# Patient Record
Sex: Male | Born: 1935 | Race: White | Hispanic: No | Marital: Married
Health system: Southern US, Community
[De-identification: ages and names within clinical notes are randomized; demographics above are authoritative.]

---

## 1998-04-06 ENCOUNTER — Ambulatory Visit (HOSPITAL_COMMUNITY): Admission: RE | Admit: 1998-04-06 | Discharge: 1998-04-06 | Payer: Self-pay | Admitting: Cardiology

## 1998-04-21 ENCOUNTER — Ambulatory Visit (HOSPITAL_COMMUNITY): Admission: RE | Admit: 1998-04-21 | Discharge: 1998-04-21 | Payer: Self-pay | Admitting: Cardiology

## 2000-05-31 ENCOUNTER — Other Ambulatory Visit: Admission: RE | Admit: 2000-05-31 | Discharge: 2000-05-31 | Payer: Self-pay | Admitting: Gastroenterology

## 2000-05-31 ENCOUNTER — Encounter (INDEPENDENT_AMBULATORY_CARE_PROVIDER_SITE_OTHER): Payer: Self-pay | Admitting: Specialist

## 2001-05-16 ENCOUNTER — Ambulatory Visit (HOSPITAL_COMMUNITY): Admission: RE | Admit: 2001-05-16 | Discharge: 2001-05-16 | Payer: Self-pay | Admitting: Cardiology

## 2001-05-16 ENCOUNTER — Encounter: Payer: Self-pay | Admitting: Cardiology

## 2001-06-11 ENCOUNTER — Encounter: Payer: Self-pay | Admitting: Vascular Surgery

## 2001-06-12 ENCOUNTER — Inpatient Hospital Stay (HOSPITAL_COMMUNITY): Admission: RE | Admit: 2001-06-12 | Discharge: 2001-06-13 | Payer: Self-pay | Admitting: Vascular Surgery

## 2001-06-12 ENCOUNTER — Encounter (INDEPENDENT_AMBULATORY_CARE_PROVIDER_SITE_OTHER): Payer: Self-pay | Admitting: *Deleted

## 2006-03-28 ENCOUNTER — Ambulatory Visit: Payer: Self-pay | Admitting: Family Medicine

## 2006-05-25 ENCOUNTER — Ambulatory Visit: Payer: Self-pay | Admitting: Rheumatology

## 2007-02-25 ENCOUNTER — Emergency Department (HOSPITAL_COMMUNITY): Admission: EM | Admit: 2007-02-25 | Discharge: 2007-02-25 | Payer: Self-pay | Admitting: Emergency Medicine

## 2007-04-25 ENCOUNTER — Ambulatory Visit: Payer: Self-pay | Admitting: Family Medicine

## 2007-06-25 ENCOUNTER — Ambulatory Visit: Payer: Self-pay | Admitting: Vascular Surgery

## 2008-07-11 ENCOUNTER — Ambulatory Visit: Payer: Self-pay | Admitting: Vascular Surgery

## 2009-06-22 ENCOUNTER — Ambulatory Visit: Payer: Self-pay | Admitting: Vascular Surgery

## 2009-07-22 ENCOUNTER — Inpatient Hospital Stay (HOSPITAL_COMMUNITY): Admission: EM | Admit: 2009-07-22 | Discharge: 2009-08-02 | Payer: Self-pay | Admitting: Emergency Medicine

## 2009-07-24 ENCOUNTER — Encounter (INDEPENDENT_AMBULATORY_CARE_PROVIDER_SITE_OTHER): Payer: Self-pay | Admitting: Internal Medicine

## 2009-07-24 ENCOUNTER — Ambulatory Visit: Payer: Self-pay | Admitting: Oncology

## 2009-07-27 ENCOUNTER — Encounter (INDEPENDENT_AMBULATORY_CARE_PROVIDER_SITE_OTHER): Payer: Self-pay | Admitting: Interventional Radiology

## 2009-07-31 ENCOUNTER — Encounter (INDEPENDENT_AMBULATORY_CARE_PROVIDER_SITE_OTHER): Payer: Self-pay | Admitting: Internal Medicine

## 2009-08-14 ENCOUNTER — Encounter: Payer: Self-pay | Admitting: Interventional Radiology

## 2010-07-29 ENCOUNTER — Ambulatory Visit: Payer: Self-pay | Admitting: Vascular Surgery

## 2010-09-30 ENCOUNTER — Inpatient Hospital Stay: Payer: Self-pay | Admitting: Internal Medicine

## 2010-12-15 LAB — BODY FLUID CULTURE: Culture: NO GROWTH

## 2010-12-15 LAB — CBC
HCT: 34.1 % — ABNORMAL LOW (ref 39.0–52.0)
HCT: 28.8 % — ABNORMAL LOW (ref 39.0–52.0)
HCT: 32 % — ABNORMAL LOW (ref 39.0–52.0)
HCT: 33.1 % — ABNORMAL LOW (ref 39.0–52.0)
Hemoglobin: 10 g/dL — ABNORMAL LOW (ref 13.0–17.0)
Hemoglobin: 10.4 g/dL — ABNORMAL LOW (ref 13.0–17.0)
Hemoglobin: 10 g/dL — ABNORMAL LOW (ref 13.0–17.0)
Hemoglobin: 10 g/dL — ABNORMAL LOW (ref 13.0–17.0)
Hemoglobin: 11.4 g/dL — ABNORMAL LOW (ref 13.0–17.0)
MCHC: 33.7 g/dL (ref 30.0–36.0)
MCHC: 33.7 g/dL (ref 30.0–36.0)
MCHC: 33.3 g/dL (ref 30.0–36.0)
MCHC: 34.1 g/dL (ref 30.0–36.0)
MCHC: 34.8 g/dL (ref 30.0–36.0)
MCV: 93.4 fL (ref 78.0–100.0)
MCV: 93.6 fL (ref 78.0–100.0)
MCV: 93.8 fL (ref 78.0–100.0)
MCV: 93.8 fL (ref 78.0–100.0)
MCV: 92.7 fL (ref 78.0–100.0)
MCV: 93.1 fL (ref 78.0–100.0)
MCV: 94.3 fL (ref 78.0–100.0)
Platelets: 289 10*3/uL (ref 150–400)
Platelets: 299 10*3/uL (ref 150–400)
Platelets: 252 10*3/uL (ref 150–400)
RBC: 3.17 MIL/uL — ABNORMAL LOW (ref 4.22–5.81)
RBC: 3.39 MIL/uL — ABNORMAL LOW (ref 4.22–5.81)
RBC: 3.62 MIL/uL — ABNORMAL LOW (ref 4.22–5.81)
RBC: 3.66 MIL/uL — ABNORMAL LOW (ref 4.22–5.81)
RBC: 3.1 MIL/uL — ABNORMAL LOW (ref 4.22–5.81)
RBC: 3.2 MIL/uL — ABNORMAL LOW (ref 4.22–5.81)
RBC: 3.44 MIL/uL — ABNORMAL LOW (ref 4.22–5.81)
RBC: 3.59 MIL/uL — ABNORMAL LOW (ref 4.22–5.81)
RDW: 15.3 % (ref 11.5–15.5)
RDW: 15.7 % — ABNORMAL HIGH (ref 11.5–15.5)
RDW: 15.6 % — ABNORMAL HIGH (ref 11.5–15.5)
RDW: 15.6 % — ABNORMAL HIGH (ref 11.5–15.5)
WBC: 8.5 10*3/uL (ref 4.0–10.5)
WBC: 8.9 10*3/uL (ref 4.0–10.5)
WBC: 8.9 10*3/uL (ref 4.0–10.5)
WBC: 4.7 10*3/uL (ref 4.0–10.5)
WBC: 7.3 10*3/uL (ref 4.0–10.5)
WBC: 7.6 10*3/uL (ref 4.0–10.5)

## 2010-12-15 LAB — URINALYSIS, ROUTINE W REFLEX MICROSCOPIC
Ketones, ur: 15 mg/dL — AB
Nitrite: NEGATIVE
Protein, ur: NEGATIVE mg/dL
pH: 5 (ref 5.0–8.0)

## 2010-12-15 LAB — BODY FLUID CELL COUNT WITH DIFFERENTIAL
Eos, Fluid: 1 %
Lymphs, Fluid: 53 %
Monocyte-Macrophage-Serous Fluid: 30 % — ABNORMAL LOW (ref 50–90)

## 2010-12-15 LAB — BASIC METABOLIC PANEL
BUN: 10 mg/dL (ref 6–23)
BUN: 16 mg/dL (ref 6–23)
BUN: 16 mg/dL (ref 6–23)
BUN: 32 mg/dL — ABNORMAL HIGH (ref 6–23)
CO2: 23 mEq/L (ref 19–32)
CO2: 24 mEq/L (ref 19–32)
CO2: 23 mEq/L (ref 19–32)
CO2: 24 mEq/L (ref 19–32)
Calcium: 8.4 mg/dL (ref 8.4–10.5)
Calcium: 8.9 mg/dL (ref 8.4–10.5)
Calcium: 8.8 mg/dL (ref 8.4–10.5)
Calcium: 9 mg/dL (ref 8.4–10.5)
Chloride: 106 mEq/L (ref 96–112)
Chloride: 112 mEq/L (ref 96–112)
Chloride: 114 mEq/L — ABNORMAL HIGH (ref 96–112)
Chloride: 108 mEq/L (ref 96–112)
Chloride: 110 mEq/L (ref 96–112)
Chloride: 112 mEq/L (ref 96–112)
Creatinine, Ser: 1.2 mg/dL (ref 0.4–1.5)
Creatinine, Ser: 1.25 mg/dL (ref 0.4–1.5)
Creatinine, Ser: 1.34 mg/dL (ref 0.4–1.5)
Creatinine, Ser: 1.36 mg/dL (ref 0.4–1.5)
Creatinine, Ser: 1.28 mg/dL (ref 0.4–1.5)
Creatinine, Ser: 1.43 mg/dL (ref 0.4–1.5)
Creatinine, Ser: 1.48 mg/dL (ref 0.4–1.5)
GFR calc Af Amer: 60 mL/min — ABNORMAL LOW (ref >60)
GFR calc Af Amer: >60 mL/min (ref >60)
GFR calc Af Amer: >60 mL/min (ref >60)
GFR calc Af Amer: >60 mL/min (ref >60)
GFR calc Af Amer: 55 mL/min — ABNORMAL LOW (ref >60)
GFR calc Af Amer: 57 mL/min — ABNORMAL LOW (ref >60)
GFR calc Af Amer: >60 mL/min (ref >60)
GFR calc non Af Amer: 50 mL/min — ABNORMAL LOW (ref >60)
GFR calc non Af Amer: 55 mL/min — ABNORMAL LOW (ref >60)
GFR calc non Af Amer: 38 mL/min — ABNORMAL LOW (ref >60)
GFR calc non Af Amer: 46 mL/min — ABNORMAL LOW (ref >60)
Glucose, Bld: 102 mg/dL — ABNORMAL HIGH (ref 70–99)
Glucose, Bld: 109 mg/dL — ABNORMAL HIGH (ref 70–99)
Glucose, Bld: 138 mg/dL — ABNORMAL HIGH (ref 70–99)
Potassium: 4 mEq/L (ref 3.5–5.1)
Potassium: 4 mEq/L (ref 3.5–5.1)
Potassium: 4 mEq/L (ref 3.5–5.1)
Potassium: 3.8 mEq/L (ref 3.5–5.1)
Potassium: 3.8 mEq/L (ref 3.5–5.1)
Potassium: 3.9 mEq/L (ref 3.5–5.1)
Potassium: 4.7 mEq/L (ref 3.5–5.1)
Sodium: 139 mEq/L (ref 135–145)
Sodium: 142 mEq/L (ref 135–145)
Sodium: 134 mEq/L — ABNORMAL LOW (ref 135–145)
Sodium: 139 mEq/L (ref 135–145)
Sodium: 142 mEq/L (ref 135–145)

## 2010-12-15 LAB — GLUCOSE, CAPILLARY
Glucose-Capillary: 105 mg/dL — ABNORMAL HIGH (ref 70–99)
Glucose-Capillary: 112 mg/dL — ABNORMAL HIGH (ref 70–99)
Glucose-Capillary: 127 mg/dL — ABNORMAL HIGH (ref 70–99)
Glucose-Capillary: 132 mg/dL — ABNORMAL HIGH (ref 70–99)
Glucose-Capillary: 163 mg/dL — ABNORMAL HIGH (ref 70–99)
Glucose-Capillary: 170 mg/dL — ABNORMAL HIGH (ref 70–99)
Glucose-Capillary: 174 mg/dL — ABNORMAL HIGH (ref 70–99)
Glucose-Capillary: 262 mg/dL — ABNORMAL HIGH (ref 70–99)
Glucose-Capillary: 104 mg/dL — ABNORMAL HIGH (ref 70–99)
Glucose-Capillary: 139 mg/dL — ABNORMAL HIGH (ref 70–99)
Glucose-Capillary: 164 mg/dL — ABNORMAL HIGH (ref 70–99)
Glucose-Capillary: 168 mg/dL — ABNORMAL HIGH (ref 70–99)
Glucose-Capillary: 214 mg/dL — ABNORMAL HIGH (ref 70–99)
Glucose-Capillary: 253 mg/dL — ABNORMAL HIGH (ref 70–99)

## 2010-12-15 LAB — CK TOTAL AND CKMB (NOT AT ARMC)
CK, MB: 1.7 ng/mL (ref 0.3–4.0)
Relative Index: 1.3 (ref 0.0–2.5)
Total CK: 128 U/L (ref 7–232)

## 2010-12-15 LAB — DIFFERENTIAL
Basophils Absolute: 0.1 10*3/uL (ref 0.0–0.1)
Basophils Relative: 1 % (ref 0–1)
Lymphocytes Relative: 3 % — ABNORMAL LOW (ref 12–46)
Monocytes Absolute: 0.1 10*3/uL (ref 0.1–1.0)
Neutro Abs: 4.4 10*3/uL (ref 1.7–7.7)
Neutrophils Relative %: 93 % — ABNORMAL HIGH (ref 43–77)

## 2010-12-15 LAB — POCT I-STAT, CHEM 8
Glucose, Bld: 235 mg/dL — ABNORMAL HIGH (ref 70–99)
HCT: 35 % — ABNORMAL LOW (ref 39.0–52.0)
Hemoglobin: 11.9 g/dL — ABNORMAL LOW (ref 13.0–17.0)
Potassium: 4.8 mEq/L (ref 3.5–5.1)

## 2010-12-15 LAB — HEPATIC FUNCTION PANEL
ALT: 24 U/L (ref 0–53)
Alkaline Phosphatase: 132 U/L — ABNORMAL HIGH (ref 39–117)
Bilirubin, Direct: <0.1 mg/dL (ref 0.0–0.3)
Total Bilirubin: 0.3 mg/dL (ref 0.3–1.2)
Total Protein: 6 g/dL (ref 6.0–8.3)

## 2010-12-15 LAB — LACTATE DEHYDROGENASE: LDH: 166 U/L (ref 94–250)

## 2010-12-15 LAB — PROTEIN ELECTROPH W RFLX QUANT IMMUNOGLOBULINS
Alpha-1-Globulin: 8.5 % — ABNORMAL HIGH (ref 2.9–4.9)
Alpha-2-Globulin: 15.8 % — ABNORMAL HIGH (ref 7.1–11.8)
Beta 2: 5.7 % (ref 3.2–6.5)
Beta Globulin: 7.4 % — ABNORMAL HIGH (ref 4.7–7.2)
Gamma Globulin: 13.7 % (ref 11.1–18.8)

## 2010-12-15 LAB — HEMOGLOBIN A1C
Hgb A1c MFr Bld: 8.4 % — ABNORMAL HIGH (ref 4.6–6.1)
Mean Plasma Glucose: 194 mg/dL

## 2010-12-15 LAB — URINE CULTURE
Colony Count: NO GROWTH
Special Requests: NEGATIVE

## 2010-12-15 LAB — CULTURE, BLOOD (ROUTINE X 2)

## 2010-12-15 LAB — PROTEIN, BODY FLUID: Total protein, fluid: 3.2 g/dL

## 2010-12-15 LAB — PROTEIN, TOTAL: Total Protein: 5.6 g/dL — ABNORMAL LOW (ref 6.0–8.3)

## 2010-12-15 LAB — LACTATE DEHYDROGENASE, PLEURAL OR PERITONEAL FLUID: LD, Fluid: 93 U/L — ABNORMAL HIGH (ref 3–23)

## 2010-12-15 LAB — POCT CARDIAC MARKERS: CKMB, poc: 1.8 ng/mL (ref 1.0–8.0)

## 2010-12-15 LAB — TROPONIN I: Troponin I: 0.02 ng/mL (ref 0.00–0.06)

## 2011-01-25 NOTE — Procedures (Signed)
CAROTID DUPLEX EXAM   INDICATION:  Follow up known carotid artery disease.   HISTORY:  Diabetes:  Yes  Cardiac:  MI in 1993  Hypertension:  No  Smoking:  No  Previous Surgery:  Left carotid endarterectomy.  CV History:  Amaurosis Fugax  No  Paresthesias No  Hemiparesis No                                       RIGHT             LEFT  Brachial systolic pressure:         150               140  Brachial Doppler waveforms:         Biphasic          Biphasic  Vertebral direction of flow:        Antegrade         Antegrade  DUPLEX VELOCITIES (cm/sec)  CCA peak systolic                   89                75  ECA peak systolic                   226               120  ICA peak systolic                   156               98  ICA end diastolic                   18                26  PLAQUE MORPHOLOGY:                  Calcified         Calcified  PLAQUE AMOUNT:                      Moderate          Mild  PLAQUE LOCATION:                    ICA and ECA       CCA   IMPRESSION:  40% to 59% stenosis noted in the right internal carotid  artery.  Normal carotid duplex noted in the left internal carotid  artery.  Antegrade bilateral vertebral arteries.   ___________________________________________  Larina Earthly, M.D.   MG/MEDQ  D:  06/25/2007  T:  06/26/2007  Job:  [id number]

## 2011-01-25 NOTE — Procedures (Signed)
CAROTID DUPLEX EXAM   INDICATION:  Follow up carotid artery disease.   HISTORY:  Diabetes:  Yes.  Cardiac:  MI.  Hypertension:  No.  Smoking:  No.  Previous Surgery:  Left carotid endarterectomy with DPA performed  06/12/2001.  CV History:  Patient is currently asymptomatic.  Amaurosis Fugax No, Paresthesias No, Hemiparesis No.                                       RIGHT             LEFT  Brachial systolic pressure:         154               156  Brachial Doppler waveforms:         WNL               WNL  Vertebral direction of flow:        Antegrade         Antegrade  DUPLEX VELOCITIES (cm/sec)  CCA peak systolic                   63                74  ECA peak systolic                   205               86  ICA peak systolic                   142               93  ICA end diastolic                   17                24  PLAQUE MORPHOLOGY:                  Heterogenous      Heterogenous  PLAQUE AMOUNT:                      Moderate          Mild  PLAQUE LOCATION:                    ICA, ECA          CCA   IMPRESSION:  1. Right internal carotid artery stenosis of 40% to 59%.  2. Left internal carotid artery is 1% to 39% stenosis.  3. Right-sided external carotid artery stenosis.    ___________________________________________  Janetta Hora Fields, MD   OD/MEDQ  D:  07/29/2010  T:  07/29/2010  Job:  [id number]

## 2011-01-25 NOTE — Procedures (Signed)
CAROTID DUPLEX EXAM   INDICATION:  Follow up carotid artery disease.   HISTORY:  Diabetes:  Yes  Cardiac:  MI  Hypertension:  No  Smoking:  No  Previous Surgery:  Left CEA with DPA on 06/12/2001.  CV History:  No  Amaurosis Fugax No, Paresthesias No, Hemiparesis No                                       RIGHT             LEFT  Brachial systolic pressure:         150               152  Brachial Doppler waveforms:         triphasic         triphasic  Vertebral direction of flow:        antegrade         antegrade  DUPLEX VELOCITIES (cm/sec)  CCA peak systolic                   100               128  ECA peak systolic                   300               76  ICA peak systolic                   193               118  ICA end diastolic                   21                22  PLAQUE MORPHOLOGY:                  calcified         calcified  PLAQUE AMOUNT:                      moderate          mild  PLAQUE LOCATION:                    ICA, ECA          CCA   IMPRESSION:  1. Right internal carotid artery shows evidence of 40% to 59%      stenosis.  2. Left internal carotid artery shows evidence of 1% to 39% stenosis.  3. Right external carotid artery shows evidence of stenosis.  4. Antegrade flow in bilateral vertebrals.   ___________________________________________  Larina Earthly, M.D.   CB/MEDQ  D:  06/22/2009  T:  06/23/2009  Job:  [id number]

## 2011-01-25 NOTE — Assessment & Plan Note (Signed)
OFFICE VISIT   Dennis Duffy, DEMARAIS  DOB:  September 08, 1926                                       07/29/2010  UEAVW#:[id number]   CHIEF COMPLAINT:  Follow-up status post left carotid endarterectomy in  2002.   HISTORY OF PRESENT ILLNESS:  Patient is an 77 year old gentleman who had  a left carotid endarterectomy by Dr. Arbie Cookey in 2002.  He is here today  with his daughter.  He denies any issues of TIA or stroke signs or  symptoms.  He states he occasionally has difficulty speaking and getting  his words out, but this is secondary to age and not a new problem.  Otherwise he denies any weakness on one side or the other or any  symptoms of amaurosis fugax.   Vascular labs are stable.  Shows a right internal carotid artery  stenosis of 40% to 59% and a left of 1% to 39%.  He has no carotid  bruits bilaterally.  His peak systolic velocity on the right was 142 and  on the left was 93.  The end-diastolic velocity was 17 on the right and  24 on the left.   PHYSICAL EXAMINATION:  This is a very frail 77 year old gentleman in no  acute distress.  He is alert and oriented and speaking appropriately and  answering questions without difficulty.  His heart rate is 77.  His  blood pressure is 106/64.  His sats are 95%.  He has no facial droop.  He has good and equal strength in bilateral upper and lower extremities.  He has a well-healed left neck scar.  Heart rate and rhythm is regular.  He has a 2/6 systolic ejection murmur.   ASSESSMENT:  Patient who is status post left carotid endarterectomy in  2002 with stable vascular laboratories.  No bruit and no symptoms.   PLAN:  He will follow up in 1 year with further vascular labs.   Della Goo, PA-C   Charles E. Fields, MD  Electronically Signed   RR/MEDQ  D:  07/29/2010  T:  07/29/2010  Job:  [id number]

## 2011-01-25 NOTE — Procedures (Signed)
CAROTID DUPLEX EXAM   INDICATION:  Follow up carotid artery disease.   HISTORY:  Diabetes:  Yes.  Cardiac:  MI.  Hypertension:  No.  Smoking:  No.  Previous Surgery:  Left CEA with DPA on 06/12/2001.  CV History:  No.  Amaurosis Fugax No, Paresthesias No, Hemiparesis No.                                       RIGHT             LEFT  Brachial systolic pressure:         158               160  Brachial Doppler waveforms:         Triphasic         Triphasic  Vertebral direction of flow:        Antegrade         Antegrade  DUPLEX VELOCITIES (cm/sec)  CCA peak systolic                   62                72  ECA peak systolic                   205               85  ICA peak systolic                   133               101  ICA end diastolic                   29                32  PLAQUE MORPHOLOGY:                  Calcified         Calcified  PLAQUE AMOUNT:                      Moderate          Mild  PLAQUE LOCATION:                    ICA/ECA           CCA   IMPRESSION:  1. Right internal carotid artery shows evidence of 40-59% stenosis.  2. Left internal carotid artery shows evidence of 1-39% stenosis.  3. Right external carotid artery stenosis.  4. No significant changes from previous study.   ___________________________________________  Larina Earthly, M.D.   AS/MEDQ  D:  07/11/2008  T:  07/11/2008  Job:  [id number]

## 2011-01-28 NOTE — Discharge Summary (Signed)
[place]  Patient:    Dennis Duffy, Dennis Duffy Visit Number: [id number] MRN: [id number]          Service Type: SUR Location: [id number] Attending Physician:  Alyson Locket Dictated by:   Lissa Merlin, P.A.C. Admit Date:  06/12/2001 Discharge Date: 06/13/2001   CC:         CVTS office  Dr. Sullivan Lone in [place], [place].   Discharge Summary  DATE OF BIRTH:  [birth year].  PHYSICIANS: 1. Larina Earthly, M.D., surgeon. 2. Dr. Sullivan Lone, primary care, [place], [place].  ADMITTING DIAGNOSIS:  Severe asymptomatic left internal carotid artery stenosis greater than 80%.  DISCHARGE DIAGNOSIS:  Severe asymptomatic left internal carotid artery stenosis greater than 80%.  PAST MEDICAL HISTORY:  Known carotid artery disease followed by doppler. Coronary artery disease with previous CABG.  Hypertension. Noninsulin-dependent diabetes mellitus.  Hypothyroidism.  Benign prostatic hypertrophy.  GERD. Glaucoma.  PROCEDURES:  Left carotid endarterectomy with Dacron patch angioplasty on June 12, 2001 by Dr. Kristen Loader. Early.  BRIEF HISTORY:  Mr. Weatherall is a 77 year old male with known carotid artery disease followed by doppler since March of 2002.  His last doppler showed severe left distal common carotid artery flow reduction bilateral, external cranial artery flow reduction greater than 80%.  Dr. Kristen Loader. Early recommended proceeding with left carotid endarterectomy in order to reduce the risk of stroke.  The patient has been completely asymptomatic prior to admission.  Risks, benefits, details, and alternatives of surgery were discussed and it was agreed to proceed.  HOSPITAL COURSE:  Mr. Buenger comes to the hospital on June 12, 2001 for the elected procedure.  There were no complications.  He was taken to PACU in stable condition.  Postoperatively, Mr. Chaput remained stable with no complications.  He was taken to unit 3300 where he did  well overnight.  The following day, he was afebrile, vital signs were stable, in sinus rhythm, and he was neurologically intact.  His neck wound was well with no edema or bleeding.  He was ambulating without difficulty and taking p.o. as well.  Laboratory work was satisfactory.  He was stable for discharge and was discharged home.  DISCHARGE MEDICATIONS:  1. He was to resume his previous home medicines.  2. Ecotrin 1 p.o. q.d.  3. Synthroid 0.05 mg p.o. q.d.  4. Glucotrol XL 2.5 mg q.d.  5. Actose 15 mg q.d.  6. Aceon 4 mg q.d.  7. Protonix 40 mg q.d.  8. Flomax 4 mg p.o. q.h.s.  9. Xalatan eye drops 1 drop each eye q.h.s. 10. Timolol eye drops 1 drop OU q.a.m.  ALLERGIES:  PENICILLIN causes swelling.  CONDITION ON DISCHARGE:  Stable.  SPECIAL INSTRUCTIONS:  Mr. Goostree was told to do no driving, no strenuous activity, no heavy lifting.  He could shower.  He was to clean his wound gently daily with soap and water and to be alert for increasing redness, swelling, drainage, and fever and to call the office if he had any problems.  FOLLOW-UP:  Follow up with Dr. Larina Earthly on Tuesday, June 28, 2001 at 11:30 a.m. Dictated by:   Lissa Merlin, P.A.C. Attending Physician:  Alyson Locket DD:  06/20/01 TD:  06/20/01 Job: [id number]

## 2011-01-28 NOTE — H&P (Signed)
[place]. [facility]  Patient:    Dennis Duffy, Dennis Duffy Visit Number: [id number] MRN: [id number]          Service Type: Attending:  Larina Earthly, M.D. Dictated by:   Durenda Age, P.A.-C. Adm. Date:  06/12/01   CC:         Pecola Lawless, M.D.   History and Physical  DATE OF BIRTH:  09/15/1925  CHIEF COMPLAINT:  Carotid artery disease.  HISTORY OF PRESENT ILLNESS:  Mr. Dennis Duffy is a pleasant 77 year old man with known history of carotid artery disease, followed up on Doppler evaluations since March 2002.  The last Doppler on September 19th showed severe left distal CCA flow reduction, bilateral ECA flow reduction greater than 80%. Dr. Kristen Loader. Early recommended to proceed with left CEA in order to reduce the risk of a stroke due to his severe CCA stenosis.  The patient denies any headache, nausea, vomiting, vertigo, dizziness, falls, seizures, numbness, tingling, muscle weakness, speech impairment, dysphagia, vision changes, syncope, presyncope, memory loss or confusion.  PAST MEDICAL HISTORY:  Coronary artery disease; hypertension; diabetes mellitus, non-insulin dependent; CAD, status post CABG; status post anteroseptal MI in 1992; GERD symptoms; BPH; hypothyroidism; glaucoma.  MEDICATIONS: 1. Ecotrin q.d. 2. Synthroid 0.05 mg q.d. 3. Glucotrol XL 2.5 mg q.d. 4. Actos 15 mg q.d. 5. Aceon 4 mg q.d. 6. Protonix 40 mg q.d. 7. Flomax 4 mg q.h.s. 8. Xalatan drops one drop in each eye q.h.s. 9. Timolol drops one in each eye q.a.m.  ALLERGIES:  PENICILLIN.  REVIEW OF SYSTEMS:  See HPI and past medical history for significant positives.  FAMILY HISTORY:  Mother and father deceased in their 24s, unknown causes.  One brother died of a motor vehicle accident.  SOCIAL HISTORY:  Married.  Two children.  He is a retired Restaurant manager, fast food. He does not smoke or drink any significant amount of alcohol.  PHYSICAL EXAMINATION:  GENERAL:  This is a  well-developed, well-nourished 77 year old white male in no acute distress, alert and oriented x 3.  VITAL SIGNS:  Blood pressure 140/70, pulse 74, respirations 18.  HEENT:  Normocephalic, atraumatic.  PERRLA.  EOMI.  Funduscopic exam reveals glaucoma bilaterally.  No cataracts or macular degeneration.  NECK:  There is a mildly palpable thyroid nodule in the left, no JVD. Bilateral soft bruits audible.  No lymphadenopathy.  CHEST:  Symmetrical on inspiration.  Lungs clear to auscultation.  CARDIOVASCULAR:  Regular rate and rhythm, 2/6 systolic murmur.  No rubs or gallops.  ABDOMEN:  Soft, nontender.  Bowel sounds x 4.  No masses.  Soft bruit on the abdomen.  GU AND RECTAL:  Deferred.  EXTREMITIES:  No clubbing, cyanosis, or edema.  No ulcerations.  Warm temperature.  PERIPHERAL PULSES:  Carotids, femorals, popliteals, dorsalis pedis and posterior tibialis 2+ bilaterally.  NEUROLOGIC:  Nonfocal.  Gait steady.  DTRs 2+ bilaterally.  Muscle strength 5/5.  ASSESSMENT AND PLAN:  Left common carotid artery stenosis, for left carotid endarterectomy by Dr. Arbie Cookey.  Dr. Arbie Cookey has seen and evaluated this patient prior to the admission and has explained the risks and benefits involved in the procedure and the patient has agreed to continue. Dictated by:   Durenda Age, P.A.-C. Attending:  Larina Earthly, M.D. DD:  06/11/01 TD:  06/11/01 Job: [id number]

## 2011-01-28 NOTE — Op Note (Signed)
[place]  Patient:    Dennis Duffy, Dennis Duffy Visit Number: [id number] MRN: [id number]          Service Type: SUR Location: 3300 3301 01 Attending Physician:  Alyson Locket Proc. Date: 06/12/01 Admit Date:  06/12/2001 Discharge Date: 06/13/2001                             Operative Report  PREOPERATIVE DIAGNOSIS:  Severe asymptomatic left common coronary artery stenosis.  POSTOPERATIVE DIAGNOSIS:  Severe asymptomatic left common coronary artery stenosis.  PROCEDURE:  Left carotid endarterectomy and Dacron patch angioplasty.  SURGEON:  Larina Earthly, M.D.  ASSISTANT:  Lissa Merlin, P.A.  ANESTHESIA:  General endotracheal anesthesia.  COMPLICATIONS:  None.  DISPOSITION:  Stable.  DESCRIPTION OF PROCEDURE:  The patient was taken to the operating room and positioned where the area of the left neck was prepped and draped in the usual sterile fashion.  Incision made anterior to the sternocleidomastoid and carried down to the platysma with electrocautery.  The sternocleidomastoid reflected posteriorly and the carotid sheath was opened.  The vagus nerve was identified and preserved.  The common carotid artery was encircled with umbilical tape and Rumel tourniquet.  The patient had extensive common carotid plaque and the incision was extended further proximally toward the sternal notch.  The carotid was mobilized below the level of the plaque.  Next, the bifurcation was exposed.  The thyroid artery was encircled with a 2-0 silk Potts tie.  The external carotid was encircled with a blue vessel loop and the internal carotid was encircled with umbilical tape and Rumel tourniquet. The hypoglossal nerve was identified and preserved.  The patient was given 7000 units of intravenous heparin and after adequate circulation time, the internal, external, and common carotid arteries were occluded.  The common carotid artery was opened with 11 blade and  extended with Potts scissors through the plaque and onto the internal carotid with Potts scissors.  A 10 shunt was passed up the internal carotid and allowed the backbleeding to go down the common carotid where it was secured with Rumel tourniquets.  The endarterectomy was begun on the common carotid artery and the plaque was divided proximally with Potts scissors.  The endarterectomy was continued onto the bifurcation and the external carotid was endarterectomized with eversion technique and the internal carotid was endarterectomized in an open fashion. Remaining atheromatous debris was removed from the endarterectomy plane.  The patient had a Finesse hemishield Dacron patch brought onto the field and was sewn as a patch angioplasty with a running 6-0 Prolene suture.  Prior to completion of the anastomosis, the shunt was immediately used and flushing maneuvers were undertaken.  The anastomosis having been completed, excellent flow characteristics were noted with the handheld Doppler in the internal and external carotid arteries.  The patient was given 50 mg of Protamine to reverse the heparin.  The wound was irrigated with saline, hemostasis with electrocautery.  The wound was closed with several 3-0 Vicryl sutures to reapproximate the sternocleidomastoid over the carotid sheath.  Next the platysma was closed with a running 3-0 Vicryl suture and finally the skin was closed with 4-0 subcuticular Vicryl stitch.  Benzoin and Steri-Strips were applied.  The patient was awakened in the operating room neurologically intact and was transferred to the recovery room in stable condition. Attending Physician:  Alyson Locket DD:  06/12/01 TD:  06/12/01 Job: [id number]

## 2011-04-01 IMAGING — CT CT T SPINE W/O CM
3 series · 16 of 33 positions shown, 19 images · non-contrast
Comparison: CT chest 07/23/2009.

CLINICAL DATA: T7 compression fracture.  Scoliosis.

CT THORACIC SPINE WITHOUT CONTRAST
TECHNIQUE: Multidetector CT imaging of the thoracic spine was
performed without intravenous contrast administration. Multiplanar
CT image reconstructions were also generated

[Series 5: t/l_spine 2.0 b31s st · axial · 0.35mm/px · z∈[-226,-28]mm · 8 of 119 slices shown, 10 images]
[im 10/119  soft-tissue]
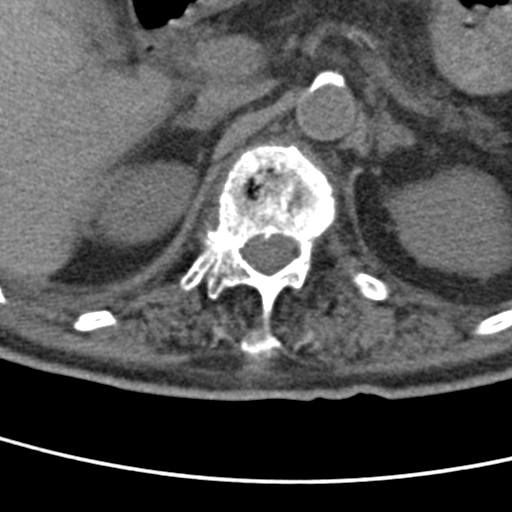
[im 10/119  bone]
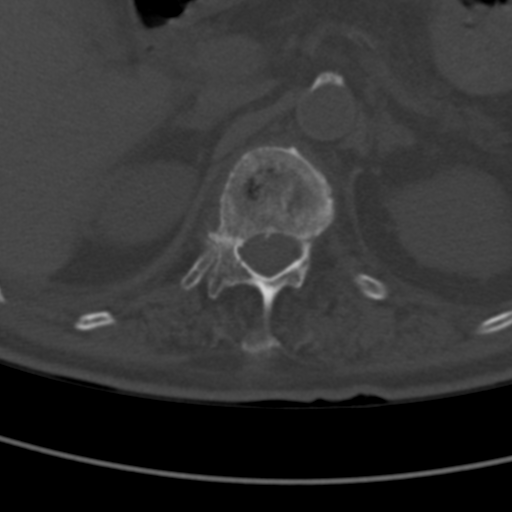
[im 28/119  bone]
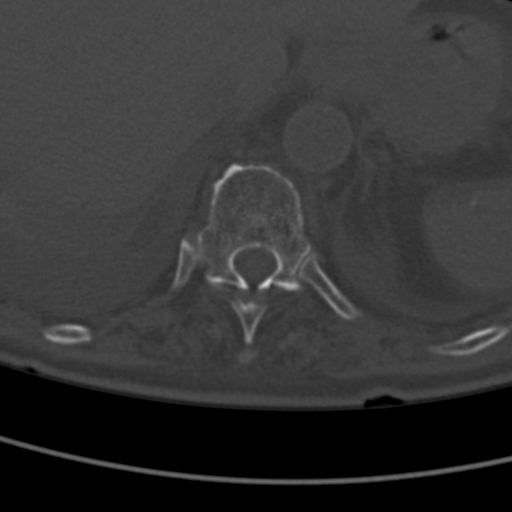
[im 37/119  bone]
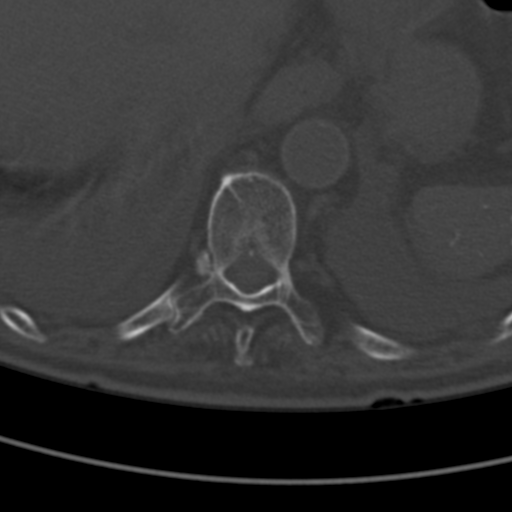
[im 55/119  bone]
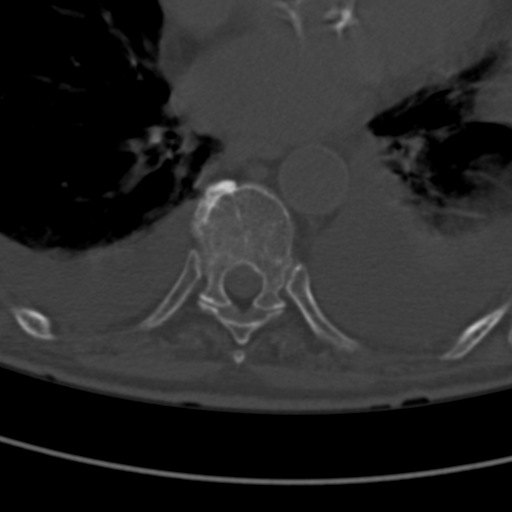
[im 64/119  soft-tissue]
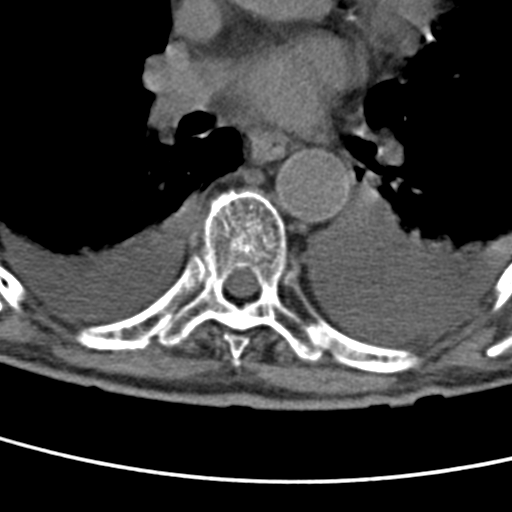
[im 64/119  bone]
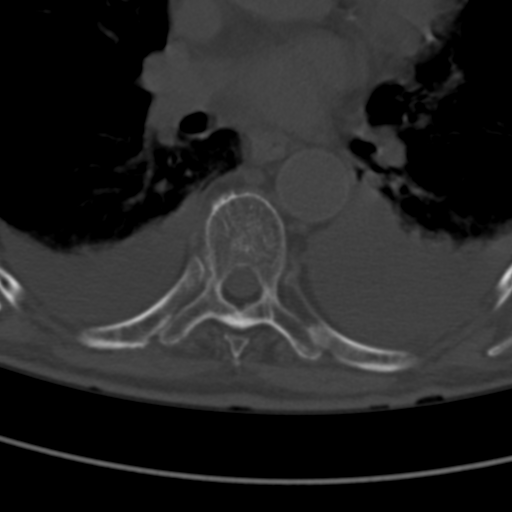
[im 82/119  bone]
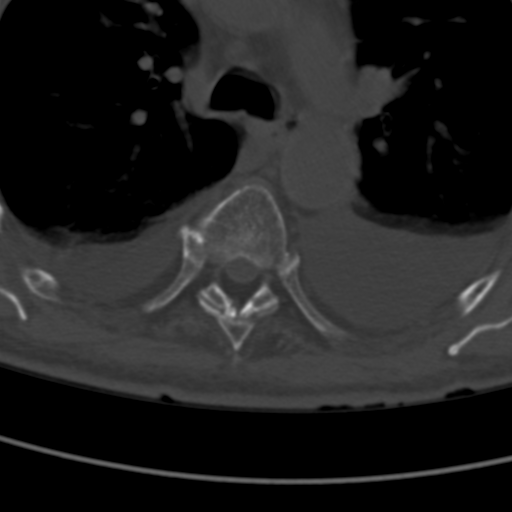
[im 91/119  bone]
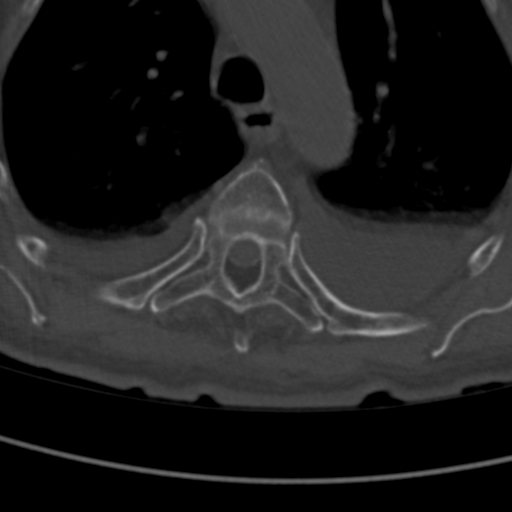
[im 109/119  bone]
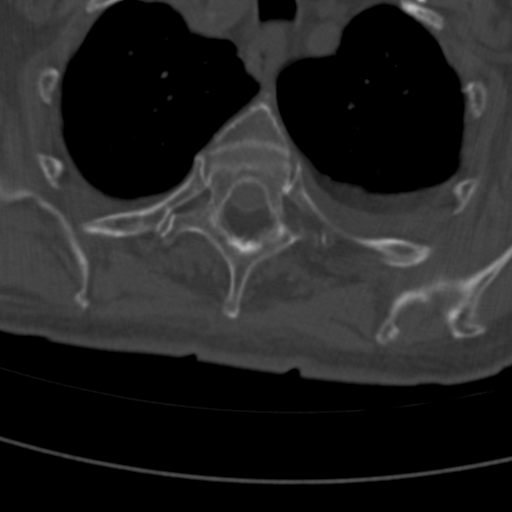

[Series 10: thins detail cor · coronal · 0.46mm/px · 3 of 47 slices shown]
[im 10/47  bone]
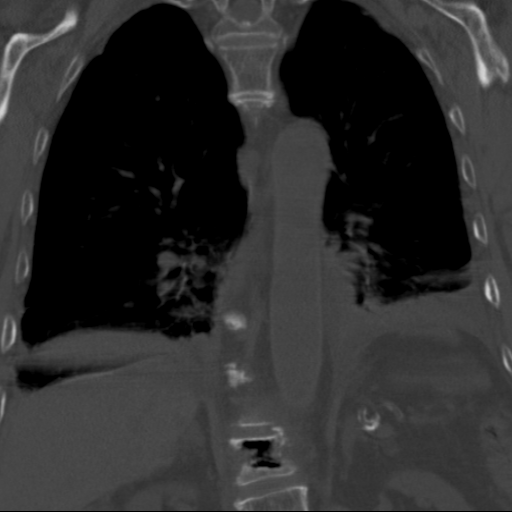
[im 19/47  bone]
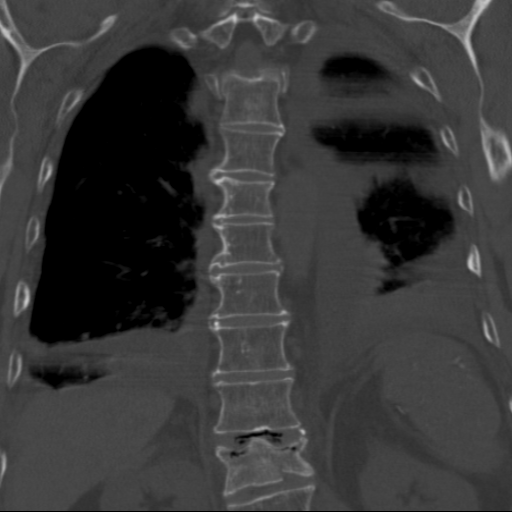
[im 28/47  bone]
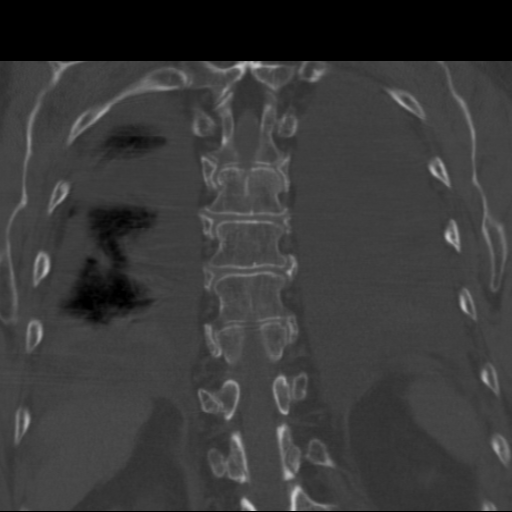

[Series 11: thins detail sag · sagittal · 0.46mm/px · 5 of 44 slices shown, 6 images]
[im 15/44  bone]
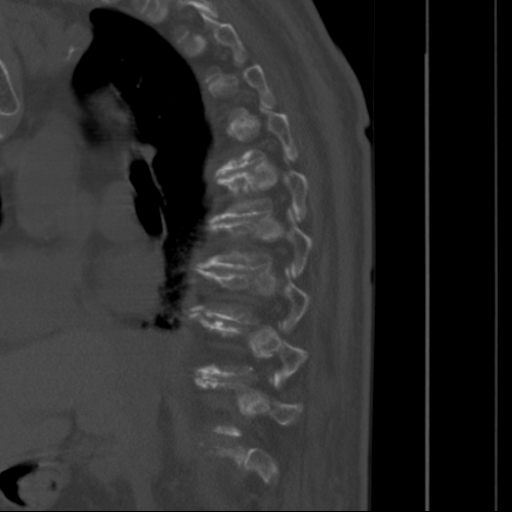
[im 18/44  bone]
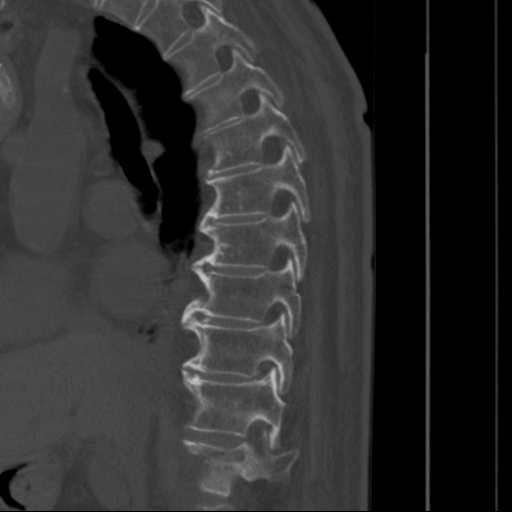
[im 22/44  soft-tissue]
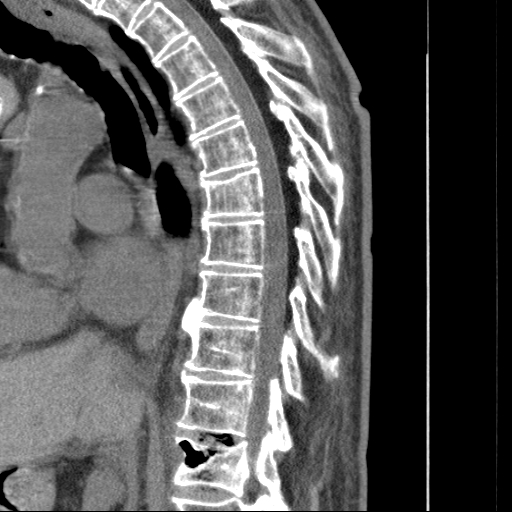
[im 22/44  bone]
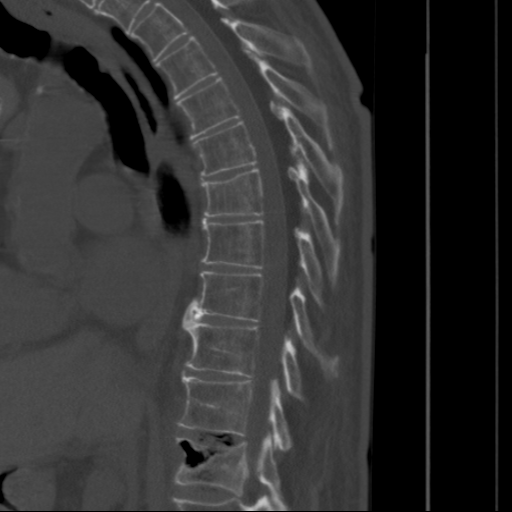
[im 26/44  bone]
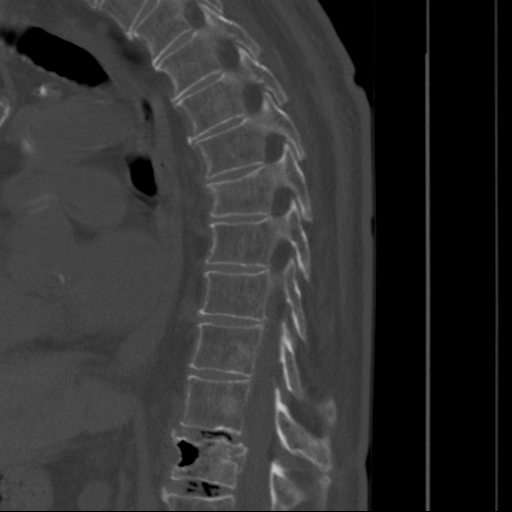
[im 29/44  bone]
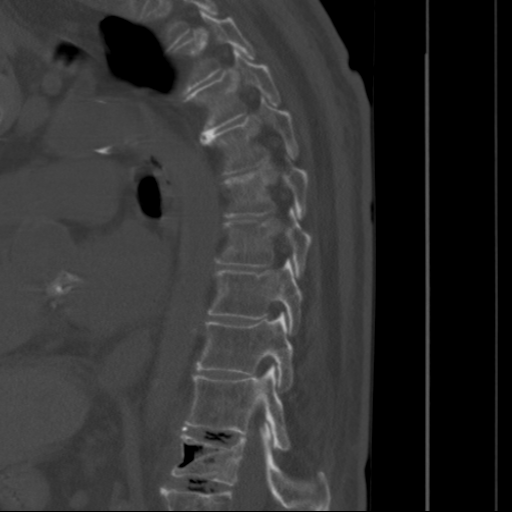

[16 of 33 positions shown; findings below may reference images not displayed]

FINDINGS: Bilateral pleural effusions are present, worse on the
left.  There is associated atelectasis, particularly at the lung
bases.  Atherosclerotic calcifications are noted in the aorta.
Heart is enlarged.

Bone windows demonstrate to some loss of height along the superior
endplate of T7.  I do not see any compressed bone to suggest that
this is an acute fracture.  I would favor this as a more remote
fracture.  There is gas throughout the T12 vertebral body,
compatible with a mobile fracture.  Gas can be seen within the disc
spaces at T11-12 and T12-L1 as well.  Mild rightward curvature is
present in the thoracic spine.  There is exaggeration of the
thoracic kyphosis.  Mild narrowing of the left T11-12 foramen is
secondary to the fracture.
IMPRESSION: 1.  Compression fracture T12 with extensive gas in the bone and in
both adjacent disc spaces.  This is compatible with a mobile acute
fracture.
2.  Superior endplate fracture of T7 without compressed bone.  I
favor this being a more remote fracture.  A bone scan could be of
use to determine the acuity of this fracture.
3.  Bilateral pleural effusions, worse on the left with associated
airspace disease likely representing atelectasis.  Infection is not
excluded.

## 2011-04-02 IMAGING — CR DG CHEST 1V PORT
1 series · 1 of 1 positions shown · non-contrast
Comparison: 07/27/2009

CLINICAL DATA: Acute renal failure.  Pain.  Fall.

PORTABLE CHEST - 1 VIEW

[view not recorded]
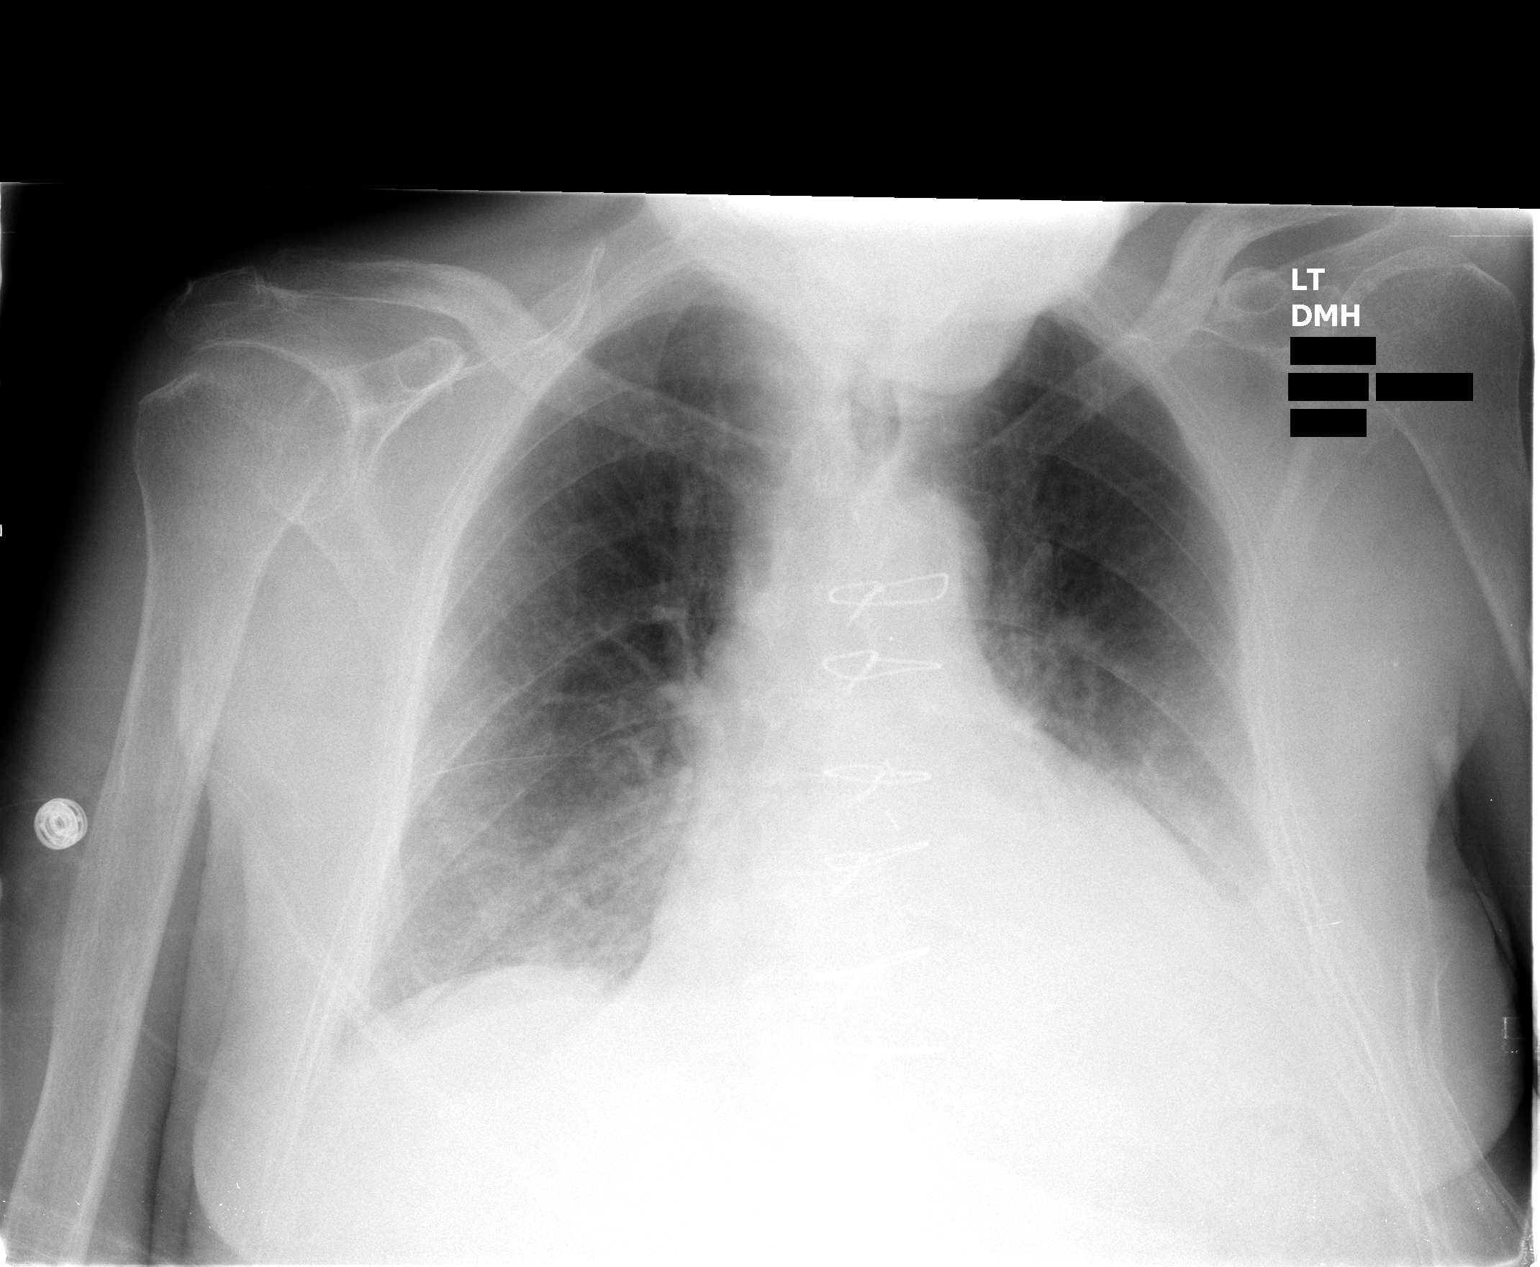

[1 of 1 positions shown; findings below may reference images not displayed]

FINDINGS: The there is mild cardiomegaly.  Bibasilar opacities,
left greater than right of increased.  Cannot exclude infection in
the left lower lobe.  Small left pleural effusion likely present.
The patient is status post CABG.
IMPRESSION: Increasing bibasilar opacities, left greater than right.  Cannot
exclude infection in the left lower lobe.

Small left effusion.

## 2011-04-03 IMAGING — CR DG CHEST 2V
1 series · 1 of 1 positions shown · non-contrast
Comparison: 07/31/2009

CLINICAL DATA: Acute renal failure.  Fall.

CHEST - 2 VIEW

[view not recorded]
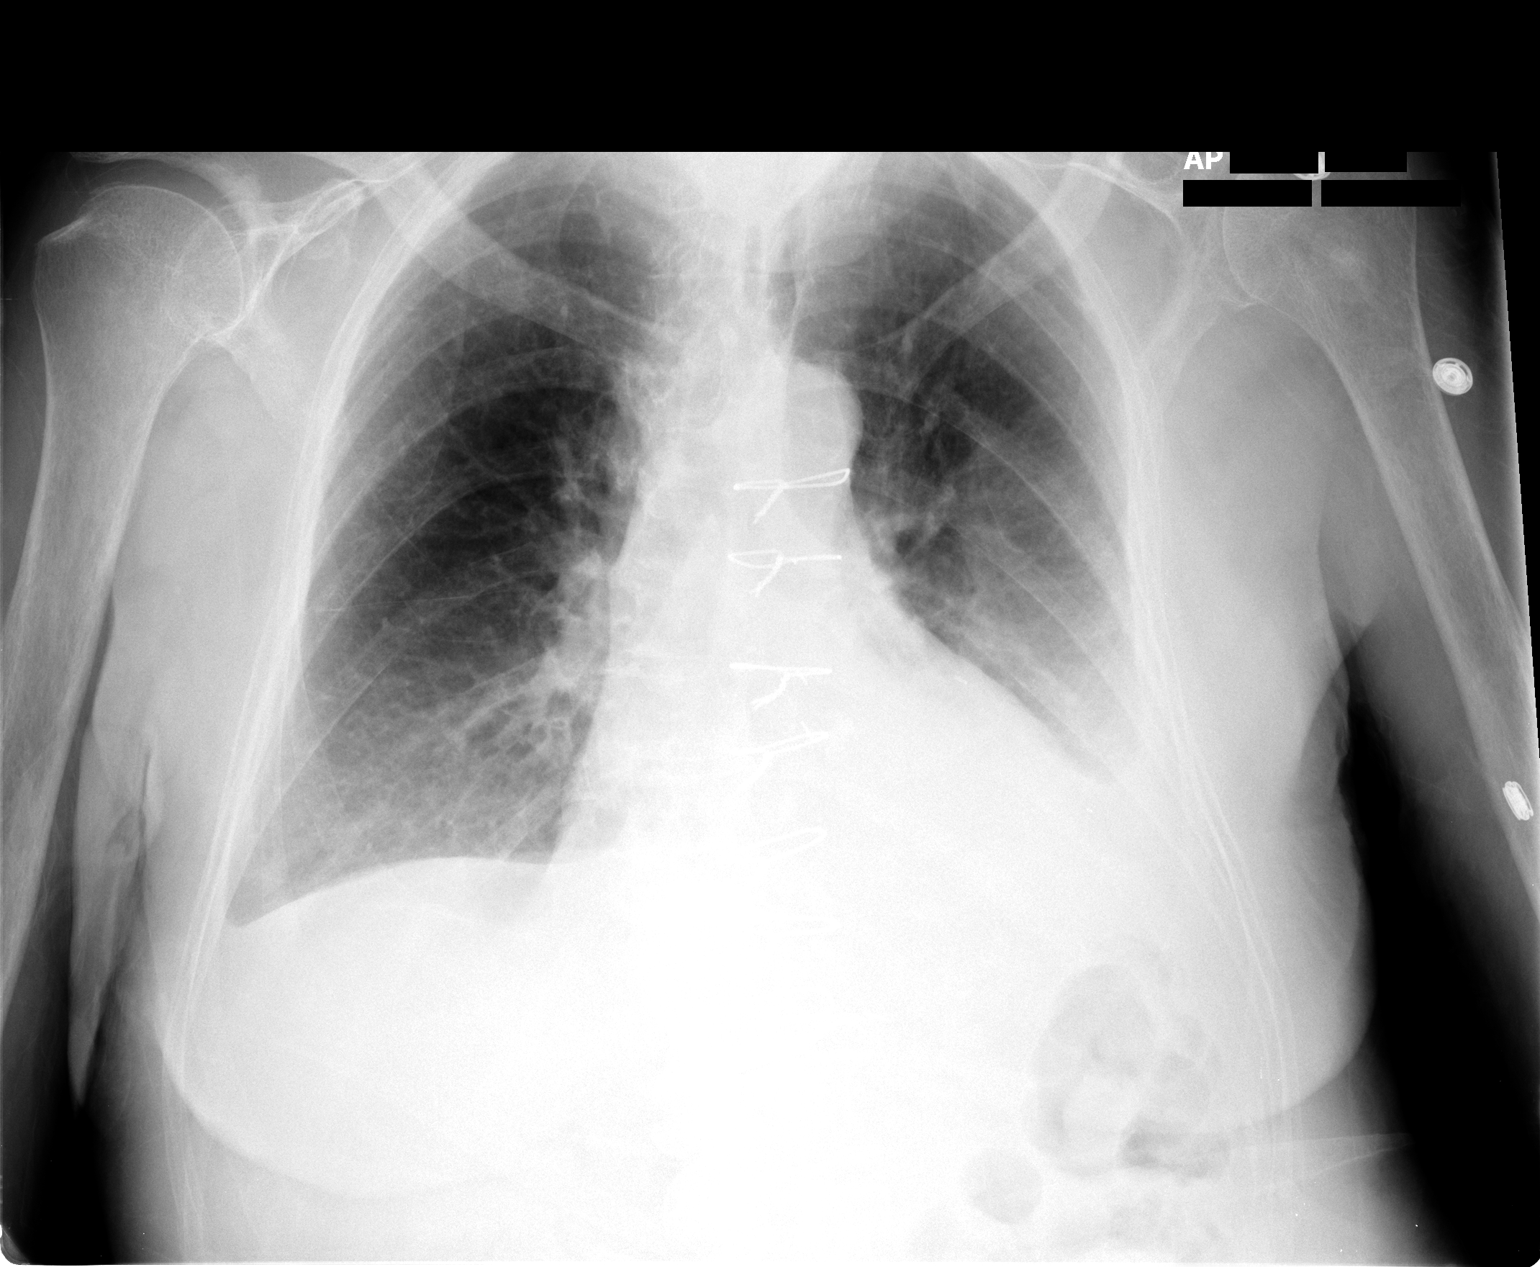

[1 of 1 positions shown; findings below may reference images not displayed]

FINDINGS: Again appreciated is left lower lobe atelectasis /
consolidation and a small left pleural effusion.  There is also a
small right pleural effusion.  Cardiomegaly  Probable COPD.  Lower
thoracic vertebroplasty site.  Sternal wire sutures.
IMPRESSION: No significant change in bilateral small pleural effusions and left
lower lobe atelectasis / consolidation.

## 2011-06-29 LAB — POCT CARDIAC MARKERS
Operator id: 272551
Troponin i, poc: <0.05

## 2011-06-29 LAB — URINALYSIS, ROUTINE W REFLEX MICROSCOPIC
Glucose, UA: 100 — AB
Hgb urine dipstick: NEGATIVE
Protein, ur: NEGATIVE
Specific Gravity, Urine: 1.029
Urobilinogen, UA: 1

## 2011-06-29 LAB — COMPREHENSIVE METABOLIC PANEL
Alkaline Phosphatase: 89
BUN: 19
Chloride: 105
Creatinine, Ser: 1.31
GFR calc non Af Amer: 53 — ABNORMAL LOW
Glucose, Bld: 263 — ABNORMAL HIGH
Potassium: 5
Total Bilirubin: 0.5

## 2011-06-29 LAB — DIFFERENTIAL
Basophils Absolute: 0
Basophils Relative: 1
Lymphocytes Relative: 19
Neutro Abs: 5
Neutrophils Relative %: 69

## 2011-06-29 LAB — URINE MICROSCOPIC-ADD ON

## 2011-06-29 LAB — CBC
HCT: 32.8 — ABNORMAL LOW
Hemoglobin: 10.7 — ABNORMAL LOW
MCV: 86.3
WBC: 7.3

## 2011-09-05 ENCOUNTER — Emergency Department: Payer: Self-pay | Admitting: Unknown Physician Specialty

## 2011-12-12 ENCOUNTER — Ambulatory Visit: Payer: Self-pay | Admitting: Internal Medicine

## 2011-12-19 ENCOUNTER — Inpatient Hospital Stay: Payer: Self-pay | Admitting: Internal Medicine

## 2011-12-19 LAB — COMPREHENSIVE METABOLIC PANEL
Albumin: 3.3 g/dL — ABNORMAL LOW (ref 3.4–5.0)
BUN: 23 mg/dL — ABNORMAL HIGH (ref 7–18)
Creatinine: 1.65 mg/dL — ABNORMAL HIGH (ref 0.60–1.30)
EGFR (African American): 52 — ABNORMAL LOW
Glucose: 224 mg/dL — ABNORMAL HIGH (ref 65–99)
Osmolality: 284 (ref 275–301)
Potassium: 4.5 mmol/L (ref 3.5–5.1)
SGPT (ALT): 8 U/L — ABNORMAL LOW
Sodium: 137 mmol/L (ref 136–145)
Total Protein: 7.6 g/dL (ref 6.4–8.2)

## 2011-12-19 LAB — URINALYSIS, COMPLETE
Bacteria: NONE SEEN
Bilirubin,UR: NEGATIVE
Hyaline Cast: 18
Leukocyte Esterase: NEGATIVE
Ph: 6 (ref 4.5–8.0)
Protein: NEGATIVE
RBC,UR: 1 /HPF (ref 0–5)
Specific Gravity: 1.01 (ref 1.003–1.030)
Squamous Epithelial: NONE SEEN
WBC UR: 1 /HPF (ref 0–5)

## 2011-12-19 LAB — CBC
MCV: 86 fL (ref 80–100)
RBC: 3.84 10*6/uL — ABNORMAL LOW (ref 4.40–5.90)
RDW: 16.8 % — ABNORMAL HIGH (ref 11.5–14.5)
WBC: 6 10*3/uL (ref 3.8–10.6)

## 2011-12-19 LAB — TROPONIN I: Troponin-I: <0.02 ng/mL

## 2011-12-20 LAB — CBC WITH DIFFERENTIAL/PLATELET
Basophil #: 0.1 10*3/uL (ref 0.0–0.1)
Basophil %: 1.3 %
Eosinophil #: 0.2 10*3/uL (ref 0.0–0.7)
HCT: 32.9 % — ABNORMAL LOW (ref 40.0–52.0)
Lymphocyte #: 0.6 10*3/uL — ABNORMAL LOW (ref 1.0–3.6)
MCH: 27.6 pg (ref 26.0–34.0)
MCHC: 32.1 g/dL (ref 32.0–36.0)
Monocyte #: 0.8 10*3/uL — ABNORMAL HIGH (ref 0.0–0.7)
Monocyte %: 14.2 %
Neutrophil #: 4 10*3/uL (ref 1.4–6.5)

## 2011-12-20 LAB — BASIC METABOLIC PANEL
Calcium, Total: 8.4 mg/dL — ABNORMAL LOW (ref 8.5–10.1)
Osmolality: 288 (ref 275–301)
Potassium: 3.9 mmol/L (ref 3.5–5.1)

## 2011-12-20 LAB — TROPONIN I: Troponin-I: <0.02 ng/mL

## 2011-12-22 LAB — CBC WITH DIFFERENTIAL/PLATELET
Basophil #: 0.1 10*3/uL (ref 0.0–0.1)
Basophil %: 1.2 %
Eosinophil #: 0.1 10*3/uL (ref 0.0–0.7)
Eosinophil %: 2.5 %
Lymphocyte #: 0.5 10*3/uL — ABNORMAL LOW (ref 1.0–3.6)
Lymphocyte %: 10.4 %
MCV: 86 fL (ref 80–100)
Monocyte #: 0.4 x10 3/mm (ref 0.2–1.0)
Monocyte %: 7.8 %
Neutrophil #: 4.1 10*3/uL (ref 1.4–6.5)
Neutrophil %: 78.1 %

## 2011-12-22 LAB — BASIC METABOLIC PANEL
Creatinine: 1.39 mg/dL — ABNORMAL HIGH (ref 0.60–1.30)
EGFR (Non-African Amer.): 52 — ABNORMAL LOW
Glucose: 113 mg/dL — ABNORMAL HIGH (ref 65–99)

## 2011-12-23 DIAGNOSIS — I369 Nonrheumatic tricuspid valve disorder, unspecified: Secondary | ICD-10-CM

## 2011-12-24 LAB — OCCULT BLOOD X 1 CARD TO LAB, STOOL: Occult Blood, Feces: NEGATIVE

## 2011-12-25 LAB — CBC WITH DIFFERENTIAL/PLATELET
Basophil #: 0 10*3/uL (ref 0.0–0.1)
Basophil %: 0 %
Lymphocyte #: 0.4 10*3/uL — ABNORMAL LOW (ref 1.0–3.6)
Monocyte #: 0.3 x10 3/mm (ref 0.2–1.0)
Monocyte %: 1.8 %
Neutrophil #: 14.2 10*3/uL — ABNORMAL HIGH (ref 1.4–6.5)
Platelet: 242 10*3/uL (ref 150–440)
RBC: 3.57 10*6/uL — ABNORMAL LOW (ref 4.40–5.90)
RDW: 17.1 % — ABNORMAL HIGH (ref 11.5–14.5)
WBC: 14.9 10*3/uL — ABNORMAL HIGH (ref 3.8–10.6)

## 2011-12-25 LAB — BASIC METABOLIC PANEL
Anion Gap: 9 (ref 7–16)
BUN: 39 mg/dL — ABNORMAL HIGH (ref 7–18)
Co2: 24 mmol/L (ref 21–32)
Potassium: 4.4 mmol/L (ref 3.5–5.1)

## 2011-12-25 LAB — CULTURE, BLOOD (SINGLE)

## 2011-12-26 LAB — CBC WITH DIFFERENTIAL/PLATELET
HCT: 31.4 % — ABNORMAL LOW (ref 40.0–52.0)
Lymphocyte #: 0.3 10*3/uL — ABNORMAL LOW (ref 1.0–3.6)
Lymphocyte %: 2.4 %
MCHC: 31.9 g/dL — ABNORMAL LOW (ref 32.0–36.0)
MCV: 85 fL (ref 80–100)
Neutrophil #: 13.5 10*3/uL — ABNORMAL HIGH (ref 1.4–6.5)
Neutrophil %: 94.6 %
RBC: 3.7 10*6/uL — ABNORMAL LOW (ref 4.40–5.90)
RDW: 17 % — ABNORMAL HIGH (ref 11.5–14.5)
WBC: 14.2 10*3/uL — ABNORMAL HIGH (ref 3.8–10.6)

## 2011-12-26 LAB — BASIC METABOLIC PANEL
Anion Gap: 9 (ref 7–16)
BUN: 49 mg/dL — ABNORMAL HIGH (ref 7–18)
Co2: 26 mmol/L (ref 21–32)
Creatinine: 1.57 mg/dL — ABNORMAL HIGH (ref 0.60–1.30)
EGFR (Non-African Amer.): 40 — ABNORMAL LOW

## 2012-01-11 ENCOUNTER — Ambulatory Visit: Payer: Self-pay | Admitting: Internal Medicine

## 2012-02-11 DEATH — deceased

## 2012-04-04 ENCOUNTER — Encounter: Payer: Self-pay | Admitting: Vascular Surgery

## 2015-01-04 NOTE — H&P (Signed)
PATIENT NAME:  Dennis Duffy, Dennis Duffy MR#:  [id number] DATE OF BIRTH:  1926-07-28  DATE OF ADMISSION:  12/19/2011  PRIMARY CARE PHYSICIAN: Dr. Sullivan Duffy  CHIEF COMPLAINT: Not eating or drinking.   HISTORY OF PRESENT ILLNESS: This is an 81 year old man who is a poor historian brought in by wife secondary to falling at home and not acting right. Unfortunately, the wife was not at the bedside at the time of my evaluation. History is obtained from the old chart and the ER physician since the patient is not the best historian. From the patient he stated he was not eating or drinking very well. He has been coughing and short of breath. He fell three times.  The ER physician believed that there was a pneumonia on chest x-ray and ordered IV Levaquin and blood cultures. In the Emergency Room he was found to have acute on chronic renal failure. Hospitalist services were contacted for further evaluation secondary to pneumonia and falls at home.   PAST MEDICAL HISTORY:  1. Coronary artery disease. 2. Mild aortic stenosis on echocardiogram.  3. Hypertension.  4. Type 2 diabetes.  5. Hyperlipidemia.  6. Hypothyroidism.  7. History of prostate cancer.  8. History of urinary retention.  9. Glaucoma.  10. Progressive super bulbar palsy. 11. Chronic kidney disease.   PAST SURGICAL HISTORY: The patient is unable to tell me but it looks like he has had coronary artery bypass grafting.  ALLERGIES: In the chart to penicillin.   MEDICATIONS: Unclear if the prescription writer is updated or not, but as per the prescription writer include:  1. Alphagan P 0.1 ophthalmic solution, one drop to affected eye twice a day.  2. Amlodipine 5 mg daily.  3. Aspirin 325 mg daily.  4. Sinemet 25/100, 1 tablet 3 times a day. 5. Cosopt 2%/0.5%, two drops twice a day to each eye.  6. Lasix 20 mg daily.  7. Glipizide 5 mg twice a day.  8. Tra Genta  5 mg daily.  9. Levothroid 88 mcg daily.  10. Protonix 40 mg daily.  11. Pravachol  20 mg at bedtime.  12. Tramadol p.r.n. pain. 13. Vesicare 5 mg daily. 14. Xalatan 0.005% solution, one drop to each eye at bedtime.  Again, in the prescription writer and I am not sure if this is up to date.   SOCIAL HISTORY: Lives with his wife. No smoking. No alcohol. No drug use. 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   FAMILY HISTORY: The patient states that his father died of a myocardial infarction. Mother died of unknown cause.  REVIEW OF SYSTEMS:  CONSTITUTIONAL: Positive for fever. No chills, no sweats. Positive for fatigue. EYES: Does wear reading glasses. EARS, NOSE, MOUTH, AND THROAT: Decreased hearing. No sore throat. No difficulty swallowing. CARDIOVASCULAR: No chest pain. No palpitations. RESPIRATORY: Positive for cough. Positive for shortness of breath. No sputum. No hemoptysis. GASTROINTESTINAL: No nausea. No vomiting. Decreased appetite. No abdominal pain, no diarrhea, no constipation, no bright red blood per rectum. No melena. GENITOURINARY: No burning on urination or hematuria. INTEGUMENT: Positive for itching. NEUROLOGIC: No fainting or blackouts. PSYCHIATRIC: No depression. ENDOCRINE:  Positive for hypothyroidism. HEMATOLOGIC/LYMPHATIC: History of anemia.   PHYSICAL EXAMINATION:  VITAL SIGNS: Temperature 97.9, pulse 78, respirations 20, blood pressure 180/75, pulse oximetry 97%.   GENERAL: No respiratory distress.   EYES: Conjunctivae normal. Lids- slight erythema around the lid.   EARS, NOSE, MOUTH, AND THROAT: Tympanic membranes no erythema. Nasal mucosa no erythema. Throat no erythema. No exudate seen.  Lips and gums no lesions.   NECK: No JVD. No bruits. No lymphadenopathy. No thyromegaly. No thyroid nodules palpated.   RESPIRATORY: Decreased breath sounds bilaterally, slight expiratory wheeze, rhonchi bilateral bases.   HEART: S1, S2 normal. No gallops, rubs, or murmurs heard. Carotid upstroke 2+ bilaterally. No bruits.   EXTREMITIES: Dorsalis pedis pulses 2+ bilaterally.    ABDOMEN: Soft, nontender. No organomegaly/splenomegaly. Normoactive bowel sounds. No masses felt.   LYMPHATIC: No lymph nodes in the neck.   MUSCULOSKELETAL: No clubbing. No edema. No cyanosis.   SKIN: Positive for blister on the right shoulder, unable to examine posteriorly by myself.   NEUROLOGIC: Cranial nerves II through XII grossly intact. Deep tendon reflexes 1+ bilateral lower extremities. The patient is able to straight leg raise bilaterally. Babinski negative.   PSYCHIATRIC: The patient is oriented to person and place. Answers yes or no questions, unable to give much history.   LABORATORY, DIAGNOSTIC, AND RADIOLOGICAL DATA: Glucose 224, BUN 23, creatinine 1.65, sodium 137, potassium 4.5, chloride 103, CO2 27, calcium 9.1, alkaline phosphatase 162, ALT 8, AST 19, albumin 3.3. White blood cell count 6, hemoglobin and hematocrit 10.6 and 32.8, platelet count 262. EKG ordered stat by me.    ASSESSMENT AND PLAN:  1. Encephalopathy with falls, possibly infection related, still waiting for urinalysis and urine culture. ER physician thought there may be a pneumonia and ordered IV Levaquin and blood cultures. Chest x-ray to me could be possibility of pneumonia versus a small left pleural effusion.  2. Possible pneumonia: We will continue IV Levaquin. We will give DuoNeb nebulizer solution.  3. Weakness with history of progressive super bulbar palsy: We will get physical therapy evaluation and continue Sinemet.  4. Hypothyroidism: We will check a TSH in the a.m. and continue Levothroid.  5. Diabetes: With his poor appetite we will just put on sliding scale and hold oral medications for right now.  6. Acute renal failure on chronic kidney disease: We will give IV fluids. Hold Lasix, and check a creatinine in the a.m.  7. Glaucoma: Continue all eyedrops.  8. Anemia: We will guaiac stools.   Unfortunately, the wife was not at the bedside and unable to be reached by phone. Unable to assess  CODE STATUS at this point and confirm medications.  The case was discussed with the nurse and wrote in a nursing order to confirm medications and update prescription Clinical research associate.  TIME SPENT ON ADMISSION: 50 minutes.   ____________________________ Herschell Dimes. Renae Gloss, MD [id number] D: 12/19/2011 20:55:35 ET T: 12/20/2011 06:32:16 ET JOB#: [id number]  cc: Herschell Dimes. Renae Gloss, MD, <Dictator> Dennis Haworth Duffy. Dennis Lone, MD Salley Scarlet MD ELECTRONICALLY SIGNED 12/23/2011 16:55

## 2015-01-04 NOTE — Consult Note (Signed)
    Comments   I spoke with pt's daughter, Okey RegalCarol, by phone. Okey RegalCarol realizes that pt is not improving despite medical therapy. I explained that pt remains on HFNC and will not be able to leave hospital on this. Will continue to try to wean. We also discussed transfer to the [facility] if clinical status remains poor. Okey RegalCarol was very interested in this and feels that this is probably the best option for her father. Family to meet tonight to discuss. Will follow up in AM.  Electronic Signatures: Ranson Belluomini, Harriett SineNancy (MD)  (Signed 15-Apr-13 16:17)  Authored: Palliative Care   Last Updated: 15-Apr-13 16:17 by Paulita Licklider, Harriett SineNancy (MD)

## 2015-01-04 NOTE — Discharge Summary (Signed)
PATIENT NAME:  Dennis Duffy, Dennis Duffy MR#:  [id number] DATE OF BIRTH:  1926-05-09  DATE OF ADMISSION:  12/19/2011 DATE OF DISCHARGE:  12/29/2011  ADMITTING PHYSICIAN: Alford Highlandichard Wieting, MD   DISCHARGING PHYSICIAN: Enid Baasadhika Nayla Dias, MD   PRIMARY MD: Dr. Sullivan LoneGilbert   CONSULTATIONS IN THE HOSPITAL:  1. Palliative Care consultation by Dr. Harvie JuniorPhifer   2. Pulmonary consultation by Dr. Belia HemanKasa    DISCHARGE DIAGNOSES:  1. Acute respiratory failure.  2. Multilobar pneumonia.  3. Chronic obstructive pulmonary disease exacerbation. 4. Congestive heart failure with diastolic dysfunction.  5. Anemia.  6. Glaucoma.  7. Hypothyroidism.  8. Diabetes mellitus.  9. Constipation.  10. Coronary artery disease.  11. Acute renal failure.  12. CKD. 13. History of prostate cancer.  14. Progressive suprabulbar palsy.   DISCHARGE MEDICATIONS:  1. Lasix 20 mg p.o. daily.  2. Protonix 40 mg p.o. daily.  3. Pravachol 20 mg p.o. daily.  4. Alphagan 0.1% ophthalmic drops to both eyes twice a day.  5. Tradjenta 5 mg p.o. daily.  6. Carbidopa/levodopa 25/100 mg 2 tablets orally three times a day.  7. Cosopt 2%-0.5% ophthalmic solution one drop both eyes twice a day.  8. VESIcare 5 mg once a day.  9. Xalatan 0.005% ophthalmic drop to each eye at bedtime.  10. Colace 100 mg p.o. b.i.d. 11. MiraLAX 17 grams powder daily p.r.n. for constipation.  12. Roxanol 20 mg/mL 0.25 to 0.5 mL p.o. or sublingual q.1 to 2 hours p.r.n.  13. Ativan 0.5 to 1 mg q.2 to 4 hours p.o. sublingual p.r.n.  14. Ranitidine 150 mg p.o. b.i.d.  15. ABHR suppository one per rectum q.4 to 6 hours p.r.n.  16. Levaquin 500 mg p.o. daily until 01/01/2012.  17. Scopolamine patch one patch transdermal at bedtime.  18. Prednisone 60 mg p.o. daily, taper off by 10 mg every day.   DISCHARGE HOME OXYGEN: 2 liters.   DISCHARGE DIET: Mechanical soft diet.   DISCHARGE ACTIVITY: As tolerated.    FOLLOW-UP INSTRUCTIONS: The patient is being transferred to  Hospice home.   LABS AND IMAGING STUDIES: WBC 14.2, hemoglobin 10.0, hematocrit 31.4, platelet count 282, sodium 140, potassium 4.0, chloride 105, bicarb 26, BUN 49, creatinine 1.5, glucose 171, calcium 8.7.   Chest x-ray from 12/26/2011 showing bilateral diffuse interstitial thickening likely representing interstitial edema versus interstitial pneumonitis. Trace bilateral pleural effusions are present. Osseous structures remain unremarkable.   2-D echo showed technically difficult study, EF 50 to 55%, mild mitral regurgitation. Elevated right-sided pressures are present.   BRIEF HOSPITAL COURSE: Please look at the history and physical done by Dr. Renae GlossWieting on admission on 12/19/2011. Dennis Duffy is an 81 year old elderly gentleman with past medical history of coronary artery disease, diabetes, hypertension, and history of prostate cancer who was brought in from home secondary to poor p.o. intake, cough, and dyspnea. He was initially admitted for metabolic encephalopathy and possible pneumonia.  1. Metabolic encephalopathy likely secondary to underlying infection, improving.  2. Acute respiratory failure secondary to multilobar pneumonia initially on nasal cannula requiring high flow nasal cannula secondary to worsening respiratory distress. He was seen by pulmonologist and was also placed on steroids for COPD and Lasix p.r.n. has been given for diastolic dysfunction. He is currently weaned down to 2 liters nasal cannula but still has increased work of breathing. After Palliative care consultation, family has made him DO NOT RESUSCITATE and he will be transferring to Hospice home currently. He will remain on low dose Lasix for now. His  antibiotics are being changed over from ceftriaxone and azithromycin to Levaquin at discharge. He will also get Roxanol p.r.n. for comfort.  3. Congestive heart failure with diastolic dysfunction, normal ejection fraction, chest x-ray with pulmonary edema kind of picture. Has  elevated right-sided pressures so low dose furosemide has clinically improved from high flow nasal cannula to 2 liters oxygen with Lasix p.r.n. while in the hospital.  4. Acute on chronic kidney disease. Initially he was dehydrated requiring fluids and then improvement and back to baseline creatinine at 1.5. Numbers were monitored while on Lasix but for now will be transferred to Hospice home.  5. Progressive suprabulbar palsy. The patient is on Sinemet.  6. Glaucoma. He can continue all of his eyedrops.  7. Constipation. Placed on MiraLAX p.r.n. and also Colace.   His course has been otherwise uneventful.   CODE STATUS: DO NOT RESUSCITATE.   DISCHARGE CONDITION: Poor with guarded prognosis.   DISCHARGE DISPOSITION: Hospice home.   TIME SPENT ON DISCHARGE: 40 minutes.   ____________________________ Enid Baas, MD [id number] D: 12/29/2011 14:48:36 ET T: 12/30/2011 11:44:23 ET JOB#: [id number]  cc: Enid Baas, MD, <Dictator> Dennis Duffy. Sullivan Lone, MD Enid Baas MD ELECTRONICALLY SIGNED 12/30/2011 14:38
